# Patient Record
Sex: Female | Born: 2005
Health system: Southern US, Community
[De-identification: ages and names within clinical notes are randomized; demographics above are authoritative.]

## PROBLEM LIST (undated history)

## (undated) DIAGNOSIS — F419 Anxiety disorder, unspecified: Secondary | ICD-10-CM

## (undated) DIAGNOSIS — G43909 Migraine, unspecified, not intractable, without status migrainosus: Secondary | ICD-10-CM

## (undated) DIAGNOSIS — F410 Panic disorder [episodic paroxysmal anxiety] without agoraphobia: Secondary | ICD-10-CM

## (undated) DIAGNOSIS — L509 Urticaria, unspecified: Secondary | ICD-10-CM

## (undated) HISTORY — PX: APPENDECTOMY: SHX54

## (undated) HISTORY — DX: Urticaria, unspecified: L50.9

---
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
## 2023-09-07 ENCOUNTER — Other Ambulatory Visit: Payer: Self-pay

## 2023-09-07 ENCOUNTER — Emergency Department (HOSPITAL_COMMUNITY)
Admission: EM | Admit: 2023-09-07 | Discharge: 2023-09-08 | Disposition: A | Discharge status: Home / Self Care | Attending: Emergency Medicine | Admitting: Emergency Medicine

## 2023-09-07 ENCOUNTER — Encounter (HOSPITAL_COMMUNITY): Payer: Self-pay

## 2023-09-07 DIAGNOSIS — B349 Viral infection, unspecified: Secondary | ICD-10-CM | POA: Diagnosis not present

## 2023-09-07 DIAGNOSIS — R1013 Epigastric pain: Secondary | ICD-10-CM | POA: Diagnosis present

## 2023-09-07 LAB — COMPREHENSIVE METABOLIC PANEL WITH GFR
ALT: 26 U/L (ref 0–44)
AST: 19 U/L (ref 15–41)
Albumin: 4.1 g/dL (ref 3.5–5.0)
Alkaline Phosphatase: 55 U/L (ref 38–126)
Anion gap: 10 (ref 5–15)
BUN: 10 mg/dL (ref 6–20)
CO2: 25 mmol/L (ref 22–32)
Calcium: 9.1 mg/dL (ref 8.9–10.3)
Chloride: 103 mmol/L (ref 98–111)
Creatinine, Ser: 0.71 mg/dL (ref 0.44–1.00)
GFR, Estimated: >60 mL/min (ref >60)
Glucose, Bld: 106 mg/dL — ABNORMAL HIGH (ref 70–99)
Potassium: 3.9 mmol/L (ref 3.5–5.1)
Sodium: 138 mmol/L (ref 135–145)
Total Bilirubin: 0.2 mg/dL (ref 0.0–1.2)
Total Protein: 7.8 g/dL (ref 6.5–8.1)

## 2023-09-07 LAB — CBC
HCT: 42.1 % (ref 36.0–46.0)
Hemoglobin: 14.3 g/dL (ref 12.0–15.0)
MCH: 31.4 pg (ref 26.0–34.0)
MCHC: 34 g/dL (ref 30.0–36.0)
MCV: 92.3 fL (ref 80.0–100.0)
Platelets: 270 K/uL (ref 150–400)
RBC: 4.56 MIL/uL (ref 3.87–5.11)
RDW: 12.4 % (ref 11.5–15.5)
WBC: 8.8 K/uL (ref 4.0–10.5)
nRBC: 0 % (ref 0.0–0.2)

## 2023-09-07 LAB — LIPASE, BLOOD: Lipase: 43 U/L (ref 11–51)

## 2023-09-07 NOTE — ED Triage Notes (Signed)
 Pov form home cc of upper abdominal pain that started last night but worse tonight. +n/v/d. Had positive pregnancy test yesterday.  LMP 08/11/2023

## 2023-09-08 ENCOUNTER — Telehealth (HOSPITAL_COMMUNITY): Payer: Self-pay

## 2023-09-08 LAB — URINALYSIS, ROUTINE W REFLEX MICROSCOPIC
Bilirubin Urine: NEGATIVE
Glucose, UA: NEGATIVE mg/dL
Hgb urine dipstick: NEGATIVE
Ketones, ur: NEGATIVE mg/dL
Leukocytes,Ua: NEGATIVE
Nitrite: NEGATIVE
Protein, ur: NEGATIVE mg/dL
Specific Gravity, Urine: 1.018 (ref 1.005–1.030)
pH: 6 (ref 5.0–8.0)

## 2023-09-08 LAB — RESP PANEL BY RT-PCR (RSV, FLU A&B, COVID)  RVPGX2
Influenza A by PCR: NEGATIVE
Influenza B by PCR: NEGATIVE
Resp Syncytial Virus by PCR: NEGATIVE
SARS Coronavirus 2 by RT PCR: NEGATIVE

## 2023-09-08 LAB — POC URINE PREG, ED: Preg Test, Ur: NEGATIVE

## 2023-09-08 LAB — HCG, QUANTITATIVE, PREGNANCY: hCG, Beta Chain, Quant, S: <1 m[IU]/mL (ref <5)

## 2023-09-08 MED ORDER — ONDANSETRON 4 MG PO TBDP
4.0000 mg | ORAL_TABLET | Freq: Once | ORAL | Status: AC
  Administered 2023-09-08: 4 mg via ORAL
  Filled 2023-09-08: qty 1

## 2023-09-08 MED ORDER — ONDANSETRON 4 MG PO TBDP: 4.0000 mg | ORAL_TABLET | Freq: Three times a day (TID) | ORAL | 0 refills | Status: AC | PRN

## 2023-09-08 NOTE — ED Provider Notes (Signed)
 [place] EMERGENCY DEPARTMENT AT [facility]  Provider Note  CSN: [id number] Arrival date & time: 09/07/23 2305  History Chief Complaint  Patient presents with   Abdominal Pain    Ann Flores is a 18 y.o. female here with significant other for evaluation of epigastric pain, onset around 2200hrs tonight, she did not have vomiting with the pain but does report intermittent N/V/D for the last several days, has had cough and fever at home recently as well. She reports a possible positive pregnancy test at home, but denies any vaginal or urinary symptoms. She was seen for similar pain about 2 weeks ago at [facility] and had negative lab workup, no imaging done then.    Home Medications Prior to Admission medications   Medication Sig Start Date End Date Taking? Authorizing Provider  ondansetron  (ZOFRAN -ODT) 4 MG disintegrating tablet Take 1 tablet (4 mg total) by mouth every 8 (eight) hours as needed for nausea or vomiting. 09/08/23  Yes Roselyn Carlin NOVAK, MD     Allergies    Iodinated contrast media   Review of Systems   Review of Systems Please see HPI for pertinent positives and negatives  Physical Exam BP 122/81   Pulse 87   Temp 97.6 F (36.4 C)   Resp 16   Ht 5' 6 (1.676 m)   Wt 78 kg   LMP 08/11/2023 (Exact Date)   SpO2 95%   BMI 27.76 kg/m   Physical Exam Vitals and nursing note reviewed.  Constitutional:      Appearance: Normal appearance.  HENT:     Head: Normocephalic and atraumatic.     Nose: Nose normal.     Mouth/Throat:     Mouth: Mucous membranes are moist.  Eyes:     Extraocular Movements: Extraocular movements intact.     Conjunctiva/sclera: Conjunctivae normal.  Cardiovascular:     Rate and Rhythm: Normal rate.  Pulmonary:     Effort: Pulmonary effort is normal.     Breath sounds: Normal breath sounds.  Abdominal:     General: Abdomen is flat.     Palpations: Abdomen is soft.     Tenderness: There is abdominal tenderness in the  epigastric area. There is no guarding. Negative signs include Murphy's sign and McBurney's sign.  Musculoskeletal:        General: No swelling. Normal range of motion.     Cervical back: Neck supple.  Skin:    General: Skin is warm and dry.  Neurological:     General: No focal deficit present.     Mental Status: She is alert.  Psychiatric:        Mood and Affect: Mood normal.     ED Results / Procedures / Treatments   EKG None  Procedures Procedures  Medications Ordered in the ED Medications  ondansetron  (ZOFRAN -ODT) disintegrating tablet 4 mg (has no administration in time range)    Initial Impression and Plan  Patient here with epigastric pain, sudden in onset but has had recent GI and viral URI symptoms as well. Exam is benign, vitals are normal. Labs done in triage show normal CBC, CMP, Lipase, UA and HCG. Discussed imaging to eval gall stones as a cause. US  is not available here, offered CT, but she prefers to see her PCP for outpatient imaging. Will check a Covid/Flu/RSV swab but she does not want to stay for results. Zofran  for nausea. RTED for any worsening symptoms or other concerns.   ED Course  MDM Rules/Calculators/A&P Medical Decision Making Problems Addressed: Epigastric pain: acute illness or injury Viral syndrome: acute illness or injury  Amount and/or Complexity of Data Reviewed Labs: ordered. Decision-making details documented in ED Course.  Risk Prescription drug management.     Final Clinical Impression(s) / ED Diagnoses Final diagnoses:  Epigastric pain  Viral syndrome    Rx / DC Orders ED Discharge Orders          Ordered    ondansetron  (ZOFRAN -ODT) 4 MG disintegrating tablet  Every 8 hours PRN        09/08/23 0125             Roselyn Carlin NOVAK, MD 09/08/23 0126

## 2023-09-08 NOTE — ED Notes (Signed)
 ED Provider at bedside.

## 2023-09-08 NOTE — Telephone Encounter (Signed)
 Pt. Called for her Covid results on her 09/08/2023 ED visit.  All results are negative.  Pt. Did not have any further questions.

## 2023-10-25 ENCOUNTER — Emergency Department (HOSPITAL_COMMUNITY)
Admission: EM | Admit: 2023-10-25 | Discharge: 2023-10-25 | Disposition: A | Discharge status: Home / Self Care | Attending: Emergency Medicine | Admitting: Emergency Medicine

## 2023-10-25 ENCOUNTER — Encounter (HOSPITAL_COMMUNITY): Payer: Self-pay

## 2023-10-25 ENCOUNTER — Emergency Department (HOSPITAL_COMMUNITY)

## 2023-10-25 DIAGNOSIS — J111 Influenza due to unidentified influenza virus with other respiratory manifestations: Secondary | ICD-10-CM | POA: Diagnosis not present

## 2023-10-25 DIAGNOSIS — R509 Fever, unspecified: Secondary | ICD-10-CM | POA: Diagnosis present

## 2023-10-25 LAB — CBC WITH DIFFERENTIAL/PLATELET
Abs Immature Granulocytes: 0.07 K/uL (ref 0.00–0.07)
Basophils Absolute: 0 K/uL (ref 0.0–0.1)
Basophils Relative: 0 %
Eosinophils Absolute: 0.1 K/uL (ref 0.0–0.5)
Eosinophils Relative: 0 %
HCT: 41.6 % (ref 36.0–46.0)
Hemoglobin: 14.2 g/dL (ref 12.0–15.0)
Immature Granulocytes: 1 %
Lymphocytes Relative: 6 %
Lymphs Abs: 0.9 K/uL (ref 0.7–4.0)
MCH: 31.3 pg (ref 26.0–34.0)
MCHC: 34.1 g/dL (ref 30.0–36.0)
MCV: 91.8 fL (ref 80.0–100.0)
Monocytes Absolute: 0.8 K/uL (ref 0.1–1.0)
Monocytes Relative: 6 %
Neutro Abs: 12.7 K/uL — ABNORMAL HIGH (ref 1.7–7.7)
Neutrophils Relative %: 87 %
Platelets: 265 K/uL (ref 150–400)
RBC: 4.53 MIL/uL (ref 3.87–5.11)
RDW: 12.2 % (ref 11.5–15.5)
WBC: 14.6 K/uL — ABNORMAL HIGH (ref 4.0–10.5)
nRBC: 0 % (ref 0.0–0.2)

## 2023-10-25 LAB — COMPREHENSIVE METABOLIC PANEL WITH GFR
ALT: 27 U/L (ref 0–44)
AST: 21 U/L (ref 15–41)
Albumin: 4.7 g/dL (ref 3.5–5.0)
Alkaline Phosphatase: 61 U/L (ref 38–126)
Anion gap: 12 (ref 5–15)
BUN: 8 mg/dL (ref 6–20)
CO2: 25 mmol/L (ref 22–32)
Calcium: 9 mg/dL (ref 8.9–10.3)
Chloride: 101 mmol/L (ref 98–111)
Creatinine, Ser: 0.91 mg/dL (ref 0.44–1.00)
GFR, Estimated: >60 mL/min (ref >60)
Glucose, Bld: 107 mg/dL — ABNORMAL HIGH (ref 70–99)
Potassium: 3.6 mmol/L (ref 3.5–5.1)
Sodium: 138 mmol/L (ref 135–145)
Total Bilirubin: 0.5 mg/dL (ref 0.0–1.2)
Total Protein: 7.9 g/dL (ref 6.5–8.1)

## 2023-10-25 LAB — GROUP A STREP BY PCR: Group A Strep by PCR: NOT DETECTED

## 2023-10-25 LAB — URINALYSIS, W/ REFLEX TO CULTURE (INFECTION SUSPECTED)
Bilirubin Urine: NEGATIVE
Glucose, UA: NEGATIVE mg/dL
Ketones, ur: NEGATIVE mg/dL
Leukocytes,Ua: NEGATIVE
Nitrite: NEGATIVE
Protein, ur: NEGATIVE mg/dL
RBC / HPF: >50 RBC/hpf (ref 0–5)
Specific Gravity, Urine: 1.02 (ref 1.005–1.030)
pH: 7 (ref 5.0–8.0)

## 2023-10-25 LAB — RESP PANEL BY RT-PCR (RSV, FLU A&B, COVID)  RVPGX2
Influenza A by PCR: NEGATIVE
Influenza B by PCR: NEGATIVE
Resp Syncytial Virus by PCR: NEGATIVE
SARS Coronavirus 2 by RT PCR: NEGATIVE

## 2023-10-25 LAB — LACTIC ACID, PLASMA
Lactic Acid, Venous: 1.2 mmol/L (ref 0.5–1.9)
Lactic Acid, Venous: 1.1 mmol/L (ref 0.5–1.9)

## 2023-10-25 MED ORDER — ACETAMINOPHEN 500 MG PO TABS
1000.0000 mg | ORAL_TABLET | Freq: Once | ORAL | Status: AC
  Administered 2023-10-25: 1000 mg via ORAL
  Filled 2023-10-25: qty 2

## 2023-10-25 MED ORDER — SODIUM CHLORIDE 0.9 % IV BOLUS
1000.0000 mL | Freq: Once | INTRAVENOUS | Status: AC
  Administered 2023-10-25: 1000 mL via INTRAVENOUS

## 2023-10-25 NOTE — ED Provider Notes (Signed)
 [place] EMERGENCY DEPARTMENT AT [facility] Provider Note   CSN: [id number] Arrival date & time: 10/25/23  1735     Patient presents with: Influenza   Ann Flores is a 18 y.o. female ending with a 1 day history of flulike symptoms.  She describes waking this morning feeling okay went to work but developed flulike symptoms including generalized bodyaches with localizing pain to her lower back, fever which was last treated with ibuprofen around 8 AM this morning, generalized headache, nausea without emesis, dry cough, mild sore throat, weakness and lightheadedness.  She also endorses 8 episodes of nonbloody diarrhea today.  She states she frequently has days where she has diarrhea at baseline but today was excessive.  She denies abdominal pain, vomiting, no dysuria.  She has had no known exposures to others with similar symptoms.  No recent antibiotic use, no recent travel.   The history is provided by the patient.       Prior to Admission medications   Medication Sig Start Date End Date Taking? Authorizing Provider  ondansetron  (ZOFRAN -ODT) 4 MG disintegrating tablet Take 1 tablet (4 mg total) by mouth every 8 (eight) hours as needed for nausea or vomiting. 09/08/23   Roselyn Carlin NOVAK, MD    Allergies: Iodinated contrast media    Review of Systems  Constitutional:  Positive for chills and fever.  HENT:  Positive for sore throat. Negative for congestion.   Eyes: Negative.   Respiratory:  Positive for cough. Negative for chest tightness and shortness of breath.   Cardiovascular:  Negative for chest pain.  Gastrointestinal:  Positive for nausea. Negative for abdominal pain and vomiting.  Genitourinary: Negative.   Musculoskeletal:  Positive for back pain. Negative for arthralgias, joint swelling and neck pain.  Skin: Negative.  Negative for rash and wound.  Neurological:  Positive for weakness and light-headedness. Negative for dizziness, numbness and headaches.   Psychiatric/Behavioral: Negative.      Updated Vital Signs BP 110/71 (BP Location: Right Arm)   Pulse 96   Temp 97.6 F (36.4 C) (Oral)   Resp 16   Ht 5' 6 (1.676 m)   Wt 79.4 kg   LMP 10/24/2023 (Exact Date)   SpO2 99%   Breastfeeding Unknown   BMI 28.25 kg/m   Physical Exam Vitals and nursing note reviewed.  Constitutional:      Appearance: She is well-developed.  HENT:     Head: Normocephalic and atraumatic.     Nose: Rhinorrhea present.     Mouth/Throat:     Pharynx: Posterior oropharyngeal erythema present.     Comments: Mild soft palate and uvula erythema, no exudate. Eyes:     Conjunctiva/sclera: Conjunctivae normal.  Cardiovascular:     Rate and Rhythm: Regular rhythm. Tachycardia present.     Heart sounds: Normal heart sounds.  Pulmonary:     Effort: Pulmonary effort is normal.     Breath sounds: Normal breath sounds. No wheezing.  Abdominal:     General: Bowel sounds are normal.     Palpations: Abdomen is soft.     Tenderness: There is no abdominal tenderness.  Musculoskeletal:        General: Tenderness present. Normal range of motion.     Cervical back: Normal range of motion. No rigidity.     Comments: Tender to palpation midline lumbar spine.  Lymphadenopathy:     Cervical: No cervical adenopathy.  Skin:    General: Skin is warm and dry.  Neurological:  Mental Status: She is alert and oriented to person, place, and time.     (all labs ordered are listed, but only abnormal results are displayed) Labs Reviewed  COMPREHENSIVE METABOLIC PANEL WITH GFR - Abnormal; Notable for the following components:      Result Value   Glucose, Bld 107 (*)    All other components within normal limits  CBC WITH DIFFERENTIAL/PLATELET - Abnormal; Notable for the following components:   WBC 14.6 (*)    Neutro Abs 12.7 (*)    All other components within normal limits  URINALYSIS, W/ REFLEX TO CULTURE (INFECTION SUSPECTED) - Abnormal; Notable for the following  components:   APPearance HAZY (*)    Hgb urine dipstick LARGE (*)    Bacteria, UA RARE (*)    All other components within normal limits  RESP PANEL BY RT-PCR (RSV, FLU A&B, COVID)  [id number]  GROUP A STREP BY PCR  LACTIC ACID, PLASMA  LACTIC ACID, PLASMA    EKG: None  Radiology: DG Chest 2 View Result Date: 10/25/2023 CLINICAL DATA:  [id number] SOB (shortness of breath) [id number] EXAM: CHEST - 2 VIEW COMPARISON:  01/29/2022 FINDINGS: No focal airspace consolidation, pleural effusion, or pneumothorax. No cardiomegaly.No acute fracture or destructive lesion. Unchanged mild dextrocurvature of the thoracic spine. IMPRESSION: No acute cardiopulmonary abnormality. Electronically Signed   By: Rogelia Myers M.D.   On: 10/25/2023 18:07     Procedures   Medications Ordered in the ED  sodium chloride 0.9 % bolus 1,000 mL (0 mLs Intravenous Stopped 10/25/23 2126)  acetaminophen  (TYLENOL ) tablet 1,000 mg (1,000 mg Oral Given 10/25/23 2038)                                    Medical Decision Making Patient presenting with influenza-like illness which started this morning.  Multiple complaints including generalized bodyaches, fever, lightheadedness nausea without emesis, episodes of nonbloody diarrhea.  She presents with a significant fever of 101.8, she last treated her fever with ibuprofen around 8 AM this morning.  She endorses generalized bodyaches along with midline low back pain.  She has no neck pain or stiffness, she does have a generalized headache.  Labs and imaging are reassuring today.  She has a normal abdominal exam, no guarding, no acute abdomen findings.  Symptoms and exam suggest viral flulike symptoms although her respiratory panel is negative today.  She was given IV fluids, her symptoms were improved after her fever resolved with Tylenol .  She was encouraged rest, increase fluid intake, Tylenol  or Motrin for any return of fever or other symptoms.  Close follow-up with her primary  provider or recheck here for any worsening symptoms.  Amount and/or Complexity of Data Reviewed Labs: ordered.    Details: Labs including CBC, c-Met and urinalysis and respiratory panel were completed.  Findings significant for greater than 50 RBCs in her urine, however she is currently on her menses.  She does have leukocytosis with a WBC count of 14.6.  Strep screen negative. Radiology: ordered.    Details: Chest x-ray negative for acute cardiopulmonary process.  Risk OTC drugs.        Final diagnoses:  Influenza-like illness    ED Discharge Orders     None          Birdena Mliss RIGGERS 10/25/23 2343    Towana Ozell BROCKS, MD 10/26/23 [phone]

## 2023-10-25 NOTE — ED Notes (Signed)
Pt ambulated to the bathroom in NAD

## 2023-10-25 NOTE — ED Triage Notes (Signed)
 Pt arrived via POV c/o new onset of flu-like symptoms including body aches, dizziness, congestion, nausea and diarrhea. Pt sent home early from work due to her symptoms.

## 2023-10-25 NOTE — Discharge Instructions (Signed)
 Rest,  Drink plenty of fluids to avoid dehydration.   Take motrin or tylenol  for achiness and fever reduction.    Get rechecked for increased shortness of breath,  fever that does not improve with tylenol  or motrin or any worsening symptoms.

## 2023-11-14 ENCOUNTER — Other Ambulatory Visit: Payer: Self-pay | Admitting: Family Medicine

## 2023-11-14 DIAGNOSIS — R1010 Upper abdominal pain, unspecified: Secondary | ICD-10-CM

## 2023-11-25 ENCOUNTER — Ambulatory Visit
Admission: RE | Admit: 2023-11-25 | Discharge: 2023-11-25 | Disposition: A | Discharge status: Home / Self Care | Source: Ambulatory Visit | Attending: Family Medicine | Admitting: Family Medicine

## 2023-11-25 DIAGNOSIS — R1010 Upper abdominal pain, unspecified: Secondary | ICD-10-CM

## 2023-12-17 ENCOUNTER — Other Ambulatory Visit: Payer: Self-pay

## 2023-12-17 ENCOUNTER — Encounter (HOSPITAL_COMMUNITY): Payer: Self-pay

## 2023-12-17 ENCOUNTER — Emergency Department (HOSPITAL_COMMUNITY)

## 2023-12-17 ENCOUNTER — Emergency Department (HOSPITAL_COMMUNITY)
Admission: EM | Admit: 2023-12-17 | Discharge: 2023-12-17 | Disposition: A | Discharge status: Home / Self Care | Attending: Emergency Medicine | Admitting: Emergency Medicine

## 2023-12-17 DIAGNOSIS — R112 Nausea with vomiting, unspecified: Secondary | ICD-10-CM

## 2023-12-17 DIAGNOSIS — K2920 Alcoholic gastritis without bleeding: Secondary | ICD-10-CM | POA: Insufficient documentation

## 2023-12-17 LAB — COMPREHENSIVE METABOLIC PANEL WITH GFR
ALT: 23 U/L (ref 0–44)
AST: 19 U/L (ref 15–41)
Albumin: 4.5 g/dL (ref 3.5–5.0)
Alkaline Phosphatase: 60 U/L (ref 38–126)
Anion gap: 8 (ref 5–15)
BUN: 9 mg/dL (ref 6–20)
CO2: 27 mmol/L (ref 22–32)
Calcium: 9.1 mg/dL (ref 8.9–10.3)
Chloride: 100 mmol/L (ref 98–111)
Creatinine, Ser: 0.88 mg/dL (ref 0.44–1.00)
GFR, Estimated: >60 mL/min (ref >60)
Glucose, Bld: 99 mg/dL (ref 70–99)
Potassium: 4 mmol/L (ref 3.5–5.1)
Sodium: 135 mmol/L (ref 135–145)
Total Bilirubin: 0.5 mg/dL (ref 0.0–1.2)
Total Protein: 7.6 g/dL (ref 6.5–8.1)

## 2023-12-17 LAB — CBC WITH DIFFERENTIAL/PLATELET
Abs Immature Granulocytes: 0.02 K/uL (ref 0.00–0.07)
Basophils Absolute: 0 K/uL (ref 0.0–0.1)
Basophils Relative: 0 %
Eosinophils Absolute: 0.1 K/uL (ref 0.0–0.5)
Eosinophils Relative: 1 %
HCT: 46.3 % — ABNORMAL HIGH (ref 36.0–46.0)
Hemoglobin: 14.9 g/dL (ref 12.0–15.0)
Immature Granulocytes: 0 %
Lymphocytes Relative: 8 %
Lymphs Abs: 0.9 K/uL (ref 0.7–4.0)
MCH: 31 pg (ref 26.0–34.0)
MCHC: 32.2 g/dL (ref 30.0–36.0)
MCV: 96.3 fL (ref 80.0–100.0)
Monocytes Absolute: 1 K/uL (ref 0.1–1.0)
Monocytes Relative: 9 %
Neutro Abs: 9 K/uL — ABNORMAL HIGH (ref 1.7–7.7)
Neutrophils Relative %: 82 %
Platelets: 238 K/uL (ref 150–400)
RBC: 4.81 MIL/uL (ref 3.87–5.11)
RDW: 12.9 % (ref 11.5–15.5)
WBC: 11 K/uL — ABNORMAL HIGH (ref 4.0–10.5)
nRBC: 0 % (ref 0.0–0.2)

## 2023-12-17 LAB — URINE DRUG SCREEN
Amphetamines: NEGATIVE
Barbiturates: NEGATIVE
Benzodiazepines: NEGATIVE
Cocaine: NEGATIVE
Fentanyl: NEGATIVE
Methadone Scn, Ur: NEGATIVE
Opiates: NEGATIVE
Tetrahydrocannabinol: NEGATIVE

## 2023-12-17 LAB — URINALYSIS, ROUTINE W REFLEX MICROSCOPIC
Bacteria, UA: NONE SEEN
Bilirubin Urine: NEGATIVE
Glucose, UA: NEGATIVE mg/dL
Ketones, ur: NEGATIVE mg/dL
Leukocytes,Ua: NEGATIVE
Nitrite: NEGATIVE
Protein, ur: NEGATIVE mg/dL
Specific Gravity, Urine: 1.023 (ref 1.005–1.030)
pH: 5 (ref 5.0–8.0)

## 2023-12-17 LAB — PREGNANCY, URINE: Preg Test, Ur: NEGATIVE

## 2023-12-17 LAB — ETHANOL: Alcohol, Ethyl (B): <15 mg/dL (ref <15)

## 2023-12-17 LAB — LIPASE, BLOOD: Lipase: 32 U/L (ref 11–51)

## 2023-12-17 MED ORDER — ONDANSETRON HCL 4 MG/2ML IJ SOLN
4.0000 mg | Freq: Once | INTRAMUSCULAR | Status: AC
  Administered 2023-12-17: 4 mg via INTRAVENOUS
  Filled 2023-12-17: qty 2

## 2023-12-17 MED ORDER — SODIUM CHLORIDE 0.9 % IV SOLN: Freq: Once | INTRAVENOUS | Status: AC

## 2023-12-17 MED ORDER — ONDANSETRON HCL 4 MG PO TABS: 4.0000 mg | ORAL_TABLET | Freq: Four times a day (QID) | ORAL | 0 refills | Status: AC

## 2023-12-17 NOTE — Discharge Instructions (Addendum)
 Follow-up with General Surgery in [facility] for further evaluation. Rest, and keep yourself hydrated. Reduce alcohol intake.

## 2023-12-17 NOTE — ED Provider Notes (Signed)
 [place] EMERGENCY DEPARTMENT AT [facility] Provider Note   CSN: [id number] Arrival date & time: 12/17/23  1530     Patient presents with: Nausea and Emesis   Ann Flores is a 18 y.o. female.  presents today with vomiting fever nausea and diarrhea.  She was diagnosed with cholecystitis in November of this year and is having gallbladder surgery on January 23 of next year.  Patient has been able to keep food down due to vomiting. She also has diffuse upper abdominal pain. Fever was 100.72F this morning. Had this symptoms for the past year on and off.  Symptoms worse with food. no blood in stool or vomit.  Drinks about 24 packs of alcohol on the weekends.  Also uses marijuana frequently. Denies diarrhea, or other symptoms at this time.     Emesis Associated symptoms: abdominal pain and diarrhea        Prior to Admission medications  Medication Sig Start Date End Date Taking? Authorizing Provider  ondansetron  (ZOFRAN ) 4 MG tablet Take 1 tablet (4 mg total) by mouth every 6 (six) hours. 12/17/23  Yes Avarae Zwart, PA-C  ondansetron  (ZOFRAN -ODT) 4 MG disintegrating tablet Take 1 tablet (4 mg total) by mouth every 8 (eight) hours as needed for nausea or vomiting. 09/08/23   Roselyn Carlin NOVAK, MD    Allergies: Iodinated contrast media    Review of Systems  Gastrointestinal:  Positive for abdominal pain, diarrhea, nausea and vomiting.    Updated Vital Signs BP 109/69   Pulse 95   Temp (!) 97.5 F (36.4 C)   Resp 18   Ht 5' 6 (1.676 m)   Wt 72.1 kg   LMP 11/22/2023 (Exact Date)   SpO2 98%   BMI 25.66 kg/m   Physical Exam Vitals and nursing note reviewed.  Constitutional:      General: She is not in acute distress.    Appearance: She is well-developed.  HENT:     Head: Normocephalic and atraumatic.  Eyes:     Conjunctiva/sclera: Conjunctivae normal.  Cardiovascular:     Rate and Rhythm: Normal rate and regular rhythm.     Heart sounds: No murmur  heard. Pulmonary:     Effort: Pulmonary effort is normal. No respiratory distress.     Breath sounds: Normal breath sounds.  Abdominal:     Palpations: Abdomen is soft.     Tenderness: There is generalized abdominal tenderness.  Musculoskeletal:        General: No swelling.     Cervical back: Neck supple.  Skin:    General: Skin is warm and dry.     Capillary Refill: Capillary refill takes less than 2 seconds.  Neurological:     Mental Status: She is alert.  Psychiatric:        Mood and Affect: Mood normal.     (all labs ordered are listed, but only abnormal results are displayed) Labs Reviewed  CBC WITH DIFFERENTIAL/PLATELET - Abnormal; Notable for the following components:      Result Value   WBC 11.0 (*)    HCT 46.3 (*)    Neutro Abs 9.0 (*)    All other components within normal limits  URINALYSIS, ROUTINE W REFLEX MICROSCOPIC - Abnormal; Notable for the following components:   APPearance HAZY (*)    Hgb urine dipstick SMALL (*)    All other components within normal limits  COMPREHENSIVE METABOLIC PANEL WITH GFR  PREGNANCY, URINE  LIPASE, BLOOD  URINE DRUG SCREEN  ETHANOL    EKG: None  Radiology: CT ABDOMEN PELVIS WO CONTRAST Result Date: 12/17/2023 EXAM: CT ABDOMEN AND PELVIS WITHOUT CONTRAST 12/17/2023 06:02:00 PM TECHNIQUE: CT of the abdomen and pelvis was performed without the administration of intravenous contrast. Multiplanar reformatted images are provided for review. Automated exposure control, iterative reconstruction, and/or weight-based adjustment of the mA/kV was utilized to reduce the radiation dose to as low as reasonably achievable. COMPARISON: 03/27/2020. CLINICAL HISTORY: diffuse abdominal pain FINDINGS: LOWER CHEST: No acute abnormality. LIVER: The liver is unremarkable. GALLBLADDER AND BILE DUCTS: Gallbladder is unremarkable. No biliary ductal dilatation. SPLEEN: Small splenule. PANCREAS: No acute abnormality. ADRENAL GLANDS: No acute abnormality.  KIDNEYS, URETERS AND BLADDER: No stones in the kidneys or ureters. No hydronephrosis. No perinephric or periureteral stranding. Urinary bladder is unremarkable. GI AND BOWEL: Stomach demonstrates no acute abnormality. Post appendectomy. There is no bowel obstruction. PERITONEUM AND RETROPERITONEUM: No ascites. No free air. VASCULATURE: Aorta is normal in caliber. LYMPH NODES: No lymphadenopathy. REPRODUCTIVE ORGANS: 3.5 cm simple left ovarian cyst, benign/physiologic. The uterus is within normal limits. BONES AND SOFT TISSUES: No acute osseous abnormality. No focal soft tissue abnormality. IMPRESSION: 1. No acute findings in the abdomen or pelvis. 2. Prior appendectomy. Electronically signed by: Pinkie Pebbles MD 12/17/2023 06:09 PM EST RP Workstation: [id number]     Procedures   Medications Ordered in the ED  ondansetron  (ZOFRAN ) injection 4 mg (4 mg Intravenous Given 12/17/23 1624)  0.9 %  sodium chloride  infusion (0 mLs Intravenous Stopped 12/17/23 1723)                                    Medical Decision Making Amount and/or Complexity of Data Reviewed Labs: ordered.  Risk Prescription drug management.     This patient presents to the ED for concern of diffuse abdominal pain nausea and vomiting that started this morning, this involves an extensive number of treatment options, and is a complaint that carries with it a high risk of complications and morbidity.  Patient was diagnosed with cholecystitis last month and is scheduled to have surgery next month.  Patient also endorses alcohol use and frequent marijuana use.    Additional history obtained:  Additional history obtained from EMR External records from outside source obtained and reviewed including show consult from 12/08/2023 for preop for gallbladder surgery.  Reviewed ultrasound abdomen from 11/25/2023 that showed cholelithiasis.    Lab Tests:  I Ordered, and personally interpreted labs.  The pertinent results include:   negative    Imaging Studies ordered:  I ordered imaging studies including CT abd pelvis   I independently visualized and interpreted imaging which showed negative study  I agree with the radiologist interpretation   Cardiac Monitoring: / EKG:  The patient was maintained on a cardiac monitor.  I personally viewed and interpreted the cardiac monitored which showed an underlying rhythm of: Normal   Problem List / ED Course / Critical interventions / Medication management  Gastritis   I ordered medication including Zofran  and fluids    Reevaluation of the patient after these medicines showed that the patient felt better  I have reviewed the patients home medicines and have made adjustments as needed   Consultations Obtained:  I requested consultation with the general surgery, and discussed lab and imaging findings as well as pertinent plan - they recommend: to discharge patient as she is stable and urgent surgery is not indicated  at this time.   Discussed results with patient which were negative and informed her that her symptoms are more likely due to her alcohol use and not her gallbladder.  Recommended her reduce alcohol intake and follow-up with general surgery in [facility] for further evaluation.  Will prescribe her Zofran  for nausea.  Patient is in agreement with plan and is stable for discharge.          Final diagnoses:  Nausea and vomiting, unspecified vomiting type  Acute alcoholic gastritis without hemorrhage    ED Discharge Orders          Ordered    ondansetron  (ZOFRAN ) 4 MG tablet  Every 6 hours        12/17/23 1848               Sayer Masini, PA-C 12/17/23 CLAIR    Cleotilde Rogue, MD 12/18/23 1010

## 2023-12-17 NOTE — ED Triage Notes (Signed)
 Patient arrives POV with mother from home c/c nausea/vomiting. States she is due to have her gallbladder removed in January. Also having diarrhea. Symptoms worse when she eats. Also endorses generalized abdominal pains.

## 2023-12-17 NOTE — ED Notes (Addendum)
 No bandage placed on IV removal site. Pt requested gauze only d/t not wanting adhesive bandage. Pressure applied to site by pt.

## 2023-12-17 NOTE — ED Provider Notes (Signed)
 This patient is a 18 year old female, she has some degree of chronic abdominal pain over the last year, she has been seen by multiple different providers including her family doctor, ER visits and ultimately had an ultrasound in November which showed that she had some gallstones, referred to general surgery who she saw on 9 days ago and recommended an outpatient elective cholecystectomy in hopes that that would help.  She continues to drink about 2 dozen beers on the weekends, smokes occasional marijuana and kidney continues to have frequent episodes of vomiting and abdominal discomfort  The CT scan is unremarkable, the lab work has been unremarkable, there is no signs of acute cholecystitis on exam she has diffuse tenderness mostly in the left lower quadrant.  I suspect that her chronic abdominal discomfort is not related to her gallbladder nevertheless I did discuss the case with the general surgeon who also recommends that the patient should be followed up outpatient and that this is not an emergent need for evaluation in the hospital by the surgeon or need for emergent surgery.  IV fluids given, tachycardia resolved, the patient is medically stable for discharge   Cleotilde Rogue, MD 12/18/23 1010
